# Patient Record
Sex: Male | Born: 1990 | Race: White | Marital: Single | Smoking: Current every day smoker
Health system: Southern US, Community
[De-identification: ages and names within clinical notes are randomized; demographics above are authoritative.]

---

## 2017-06-13 ENCOUNTER — Emergency Department: Payer: Self-pay

## 2017-06-13 ENCOUNTER — Inpatient Hospital Stay
Admission: EM | Admit: 2017-06-13 | Discharge: 2017-06-14 | DRG: 158 | Disposition: A | Discharge status: Home / Self Care | Payer: Self-pay | Attending: Internal Medicine | Admitting: Internal Medicine

## 2017-06-13 DIAGNOSIS — K047 Periapical abscess without sinus: Principal | ICD-10-CM | POA: Diagnosis present

## 2017-06-13 DIAGNOSIS — K122 Cellulitis and abscess of mouth: Secondary | ICD-10-CM | POA: Diagnosis present

## 2017-06-13 DIAGNOSIS — F17200 Nicotine dependence, unspecified, uncomplicated: Secondary | ICD-10-CM | POA: Diagnosis present

## 2017-06-13 DIAGNOSIS — K0889 Other specified disorders of teeth and supporting structures: Secondary | ICD-10-CM

## 2017-06-13 DIAGNOSIS — F1010 Alcohol abuse, uncomplicated: Secondary | ICD-10-CM | POA: Diagnosis present

## 2017-06-13 DIAGNOSIS — K029 Dental caries, unspecified: Secondary | ICD-10-CM | POA: Diagnosis present

## 2017-06-13 DIAGNOSIS — L0291 Cutaneous abscess, unspecified: Secondary | ICD-10-CM | POA: Diagnosis present

## 2017-06-13 DIAGNOSIS — R1310 Dysphagia, unspecified: Secondary | ICD-10-CM | POA: Diagnosis present

## 2017-06-13 LAB — CBC WITH DIFFERENTIAL/PLATELET
BASOS ABS: 0 10*3/uL (ref 0–0.1)
BASOS PCT: 0 %
EOS PCT: 0 %
Eosinophils Absolute: 0 10*3/uL (ref 0–0.7)
HCT: 41.7 % (ref 40.0–52.0)
Hemoglobin: 15.1 g/dL (ref 13.0–18.0)
Lymphocytes Relative: 11 %
Lymphs Abs: 1.4 10*3/uL (ref 1.0–3.6)
MCH: 33.4 pg (ref 26.0–34.0)
MCHC: 36.2 g/dL — AB (ref 32.0–36.0)
MCV: 92.4 fL (ref 80.0–100.0)
MONOS PCT: 8 %
Monocytes Absolute: 0.9 10*3/uL (ref 0.2–1.0)
Neutro Abs: 9.6 10*3/uL — ABNORMAL HIGH (ref 1.4–6.5)
Neutrophils Relative %: 81 %
PLATELETS: 170 10*3/uL (ref 150–440)
RBC: 4.51 MIL/uL (ref 4.40–5.90)
RDW: 12.1 % (ref 11.5–14.5)
WBC: 11.9 10*3/uL — ABNORMAL HIGH (ref 3.8–10.6)

## 2017-06-13 LAB — COMPREHENSIVE METABOLIC PANEL
ALBUMIN: 5 g/dL (ref 3.5–5.0)
ALK PHOS: 46 U/L (ref 38–126)
ALT: 16 U/L — AB (ref 17–63)
AST: 21 U/L (ref 15–41)
Anion gap: 10 (ref 5–15)
BILIRUBIN TOTAL: 2.7 mg/dL — AB (ref 0.3–1.2)
BUN: 10 mg/dL (ref 6–20)
CALCIUM: 9.7 mg/dL (ref 8.9–10.3)
CO2: 25 mmol/L (ref 22–32)
Chloride: 100 mmol/L — ABNORMAL LOW (ref 101–111)
Creatinine, Ser: 0.87 mg/dL (ref 0.61–1.24)
GFR calc Af Amer: >60 mL/min (ref >60)
GFR calc non Af Amer: >60 mL/min (ref >60)
GLUCOSE: 110 mg/dL — AB (ref 65–99)
Potassium: 3.7 mmol/L (ref 3.5–5.1)
Sodium: 135 mmol/L (ref 135–145)
TOTAL PROTEIN: 9.2 g/dL — AB (ref 6.5–8.1)

## 2017-06-13 MED ORDER — ONDANSETRON HCL 4 MG PO TABS: 4.0000 mg | ORAL_TABLET | Freq: Four times a day (QID) | ORAL | Status: DC | PRN

## 2017-06-13 MED ORDER — DEXAMETHASONE SODIUM PHOSPHATE 10 MG/ML IJ SOLN
10.0000 mg | Freq: Once | INTRAMUSCULAR | Status: AC
  Administered 2017-06-13: 10 mg via INTRAVENOUS
  Filled 2017-06-13: qty 1

## 2017-06-13 MED ORDER — AMPICILLIN-SULBACTAM SODIUM 3 (2-1) G IJ SOLR
3.0000 g | Freq: Once | INTRAMUSCULAR | Status: AC
  Administered 2017-06-13: 3 g via INTRAVENOUS

## 2017-06-13 MED ORDER — DEXAMETHASONE SODIUM PHOSPHATE 10 MG/ML IJ SOLN
10.0000 mg | Freq: Three times a day (TID) | INTRAMUSCULAR | Status: DC
Start: 2017-06-13 — End: 2017-06-14
  Administered 2017-06-13 – 2017-06-14 (×2): 10 mg via INTRAVENOUS
  Filled 2017-06-13 (×2): qty 1

## 2017-06-13 MED ORDER — PANTOPRAZOLE SODIUM 40 MG PO TBEC: 40.0000 mg | DELAYED_RELEASE_TABLET | Freq: Every day | ORAL | Status: DC

## 2017-06-13 MED ORDER — SODIUM CHLORIDE 0.9 % IV SOLN
3.0000 g | Freq: Once | INTRAVENOUS | Status: DC
  Filled 2017-06-13: qty 3

## 2017-06-13 MED ORDER — ONDANSETRON HCL 4 MG/2ML IJ SOLN: 4.0000 mg | Freq: Four times a day (QID) | INTRAMUSCULAR | Status: DC | PRN

## 2017-06-13 MED ORDER — METHYLPREDNISOLONE SODIUM SUCC 125 MG IJ SOLR
125.0000 mg | Freq: Once | INTRAMUSCULAR | Status: AC
  Administered 2017-06-13: 125 mg via INTRAVENOUS
  Filled 2017-06-13: qty 2

## 2017-06-13 MED ORDER — CLINDAMYCIN PHOSPHATE 600 MG/50ML IV SOLN
600.0000 mg | Freq: Once | INTRAVENOUS | Status: AC
Start: 2017-06-13 — End: 2017-06-13
  Administered 2017-06-13: 600 mg via INTRAVENOUS
  Filled 2017-06-13: qty 50

## 2017-06-13 MED ORDER — SENNOSIDES-DOCUSATE SODIUM 8.6-50 MG PO TABS: 1.0000 | ORAL_TABLET | Freq: Every evening | ORAL | Status: DC | PRN

## 2017-06-13 MED ORDER — IOPAMIDOL (ISOVUE-300) INJECTION 61%
75.0000 mL | Freq: Once | INTRAVENOUS | Status: AC | PRN
  Administered 2017-06-13: 75 mL via INTRAVENOUS

## 2017-06-13 MED ORDER — SODIUM CHLORIDE 0.9 % IV BOLUS (SEPSIS)
1000.0000 mL | Freq: Once | INTRAVENOUS | Status: AC
  Administered 2017-06-13: 1000 mL via INTRAVENOUS

## 2017-06-13 MED ORDER — KETOROLAC TROMETHAMINE 15 MG/ML IJ SOLN: 15.0000 mg | Freq: Four times a day (QID) | INTRAMUSCULAR | Status: DC | PRN

## 2017-06-13 MED ORDER — MORPHINE SULFATE (PF) 4 MG/ML IV SOLN
4.0000 mg | Freq: Once | INTRAVENOUS | Status: AC
  Administered 2017-06-13: 4 mg via INTRAVENOUS
  Filled 2017-06-13: qty 1

## 2017-06-13 MED ORDER — SODIUM CHLORIDE 0.9 % IV SOLN
3.0000 g | Freq: Four times a day (QID) | INTRAVENOUS | Status: DC
  Administered 2017-06-13 – 2017-06-14 (×4): 3 g via INTRAVENOUS
  Filled 2017-06-13 (×6): qty 3

## 2017-06-13 MED ORDER — ACETAMINOPHEN 650 MG RE SUPP: 650.0000 mg | Freq: Four times a day (QID) | RECTAL | Status: DC | PRN

## 2017-06-13 MED ORDER — IBUPROFEN 400 MG PO TABS: 600.0000 mg | ORAL_TABLET | Freq: Four times a day (QID) | ORAL | Status: DC | PRN

## 2017-06-13 MED ORDER — ONDANSETRON HCL 4 MG/2ML IJ SOLN
4.0000 mg | Freq: Once | INTRAMUSCULAR | Status: AC
  Administered 2017-06-13: 4 mg via INTRAVENOUS
  Filled 2017-06-13: qty 2

## 2017-06-13 MED ORDER — ACETAMINOPHEN 325 MG PO TABS: 650.0000 mg | ORAL_TABLET | Freq: Four times a day (QID) | ORAL | Status: DC | PRN

## 2017-06-13 MED ORDER — SODIUM CHLORIDE 0.9 % IV SOLN
INTRAVENOUS | Status: AC
  Administered 2017-06-13 – 2017-06-14 (×2): via INTRAVENOUS

## 2017-06-13 NOTE — ED Notes (Signed)
NAD noted at this time. CIWA performed per MD order. Pt visualized in NAD, resting in bed, playing on phone and watching TV. Pt had visitor come to room at this time. Will continue to monitor for further patient needs.

## 2017-06-13 NOTE — ED Notes (Signed)
To CT scan via stretcher.  AAOx3.  Skin warm and dry.  NAD 

## 2017-06-13 NOTE — ED Triage Notes (Signed)
Pt c/o toothache that started on Friday and today having facial swelling..Marland Kitchen

## 2017-06-13 NOTE — ED Notes (Signed)
More swelling noted to roof of mouth  Provider in with pt at this time  Report called to Erskine SquibbJane RN  And pt moved to room 26

## 2017-06-13 NOTE — Progress Notes (Signed)
Patient admitted from ED, IV fluids infusing. No complaints at this time.

## 2017-06-13 NOTE — ED Notes (Signed)
Dr. Ellan LambertMqueen at bedside.

## 2017-06-13 NOTE — H&P (Signed)
[facility] [place] [facility]   PATIENT NAME: Andrew Spence    MR#:  [id number]  DATE OF BIRTH:  1990-12-31  DATE OF ADMISSION:  06/13/2017  PRIMARY CARE PHYSICIAN: Patient, No Pcp Per   REQUESTING/REFERRING PHYSICIAN: Derrill Kay  CHIEF COMPLAINT:  Toothache and left jaw swelling  HISTORY OF PRESENT ILLNESS:  Andrew Spence  is a 26 y.o. male with No known medical history is presenting to the ED with a chief complaint of 3 day history of tooth pain and today history of left jaw swelling with difficulty swallowing but denies any shortness of breath or chest pain. Left jaw pain is radiating to the right jaw. No similar complaints in the past and was not seen by any dentist Discussed with the ENT on call, who has recommended IV antibiotics and steroids  PAST MEDICAL HISTORY:  History reviewed. No pertinent past medical history.  PAST SURGICAL HISTOIRY:  History reviewed. No pertinent surgical history.  SOCIAL HISTORY:   Social History  Substance Use Topics  . Smoking status: Current Every Day Smoker  . Smokeless tobacco: Never Used  . Alcohol use Yes    FAMILY HISTORY:  Reports a grandfather and grandmother has cancers doesn't know the name of the cancer  DRUG ALLERGIES:  No Known Allergies  REVIEW OF SYSTEMS:  CONSTITUTIONAL: No fever, fatigue or weakness.  EYES: No blurred or double vision.  EARS, NOSE, AND THROAT: Reporting toothache and left jaw pain radiating to the right jaw and neck RESPIRATORY: No cough, shortness of breath, wheezing or hemoptysis.  CARDIOVASCULAR: No chest pain, orthopnea, edema.  GASTROINTESTINAL: No nausea, vomiting, diarrhea or abdominal pain.  GENITOURINARY: No dysuria, hematuria.  ENDOCRINE: No polyuria, nocturia,  HEMATOLOGY: No anemia, easy bruising or bleeding SKIN: No rash or lesion. MUSCULOSKELETAL: No joint pain or arthritis.   NEUROLOGIC: No tingling, numbness, weakness.  PSYCHIATRY: No anxiety or  depression.   MEDICATIONS AT HOME:   Prior to Admission medications   Not on File      VITAL SIGNS:  Blood pressure (!) 137/95, pulse 92, temperature 99 F (37.2 C), temperature source Oral, resp. rate 16, height 6\' 2"  (1.88 m), weight 113.4 kg (250 lb), SpO2 99 %.  PHYSICAL EXAMINATION:  GENERAL:  26 y.o.-year-old patient lying in the bed with no acute distress.  EYES: Pupils equal, round, reactive to light and accommodation. No scleral icterus. Extraocular muscles intact.  HEENT:Left jaw is tender and edematous. Head atraumatic, normocephalic. Oropharynx-could not examine as patient is unable to open his mouth completely NECK:  Supple, anterior cervical lymphadenopathy is present ,no jugular venous distention. No thyroid enlargement LUNGS: Normal breath sounds bilaterally, no wheezing, rales,rhonchi or crepitation. No use of accessory muscles of respiration.  CARDIOVASCULAR: S1, S2 normal. No murmurs, rubs, or gallops.  ABDOMEN: Soft, nontender, nondistended. Bowel sounds present. No organomegaly or mass.  EXTREMITIES: No pedal edema, cyanosis, or clubbing.  NEUROLOGIC: Cranial nerves II through XII are intact. Muscle strength 5/5 in all extremities. Sensation intact. Gait not checked.  PSYCHIATRIC: The patient is alert and oriented x 3.  SKIN: No obvious rash, lesion, or ulcer.   LABORATORY PANEL:   CBC  Recent Labs Lab 06/13/17 1014  WBC 11.9*  HGB 15.1  HCT 41.7  PLT 170   ------------------------------------------------------------------------------------------------------------------  Chemistries   Recent Labs Lab 06/13/17 1014  NA 135  K 3.7  CL 100*  CO2 25  GLUCOSE 110*  BUN 10  CREATININE 0.87  CALCIUM 9.7  AST  21  ALT 16*  ALKPHOS 46  BILITOT 2.7*   ------------------------------------------------------------------------------------------------------------------  Cardiac Enzymes No results for input(s): TROPONINI in the last 168  hours. ------------------------------------------------------------------------------------------------------------------  RADIOLOGY:  Ct Soft Tissue Neck W Contrast  Result Date: 06/13/2017 CLINICAL DATA:  Facial and neck swelling. Dental pain. Elevated white blood count. EXAM: CT NECK WITH CONTRAST TECHNIQUE: Multidetector CT imaging of the neck was performed using the standard protocol following the bolus administration of intravenous contrast. CONTRAST:  75mL ISOVUE-300 IOPAMIDOL (ISOVUE-300) INJECTION 61% COMPARISON:  None. FINDINGS: Pharynx and larynx: Mild hypertrophy of the tonsils bilaterally. No mass or peritonsillar abscess. Remainder of the pharynx is normal. Salivary glands: Parotid and submandibular glands normal bilaterally. Thyroid: Negative Lymph nodes: Prominent lymph nodes throughout the neck bilaterally. Right level 2 node 10 mm. Left level 2 node 14 mm. Left level 2 lymph node 10 mm. Multiple small posterior nodes bilaterally. These likely reactive due to infection given the history. Vascular: Patent Limited intracranial: Negative Visualized orbits: Negative Mastoids and visualized paranasal sinuses: Negative Skeleton: No acute skeletal abnormality. Mild lucency around left lower first molar with associated caries. No evidence of periapical abscess. Upper chest: Negative Other: Subcutaneous edema is present below the chin extending to the platysmas muscle. 6 x 12 mm fluid collection deep to the left mylohyoid muscle consistent with early abscess in the floor of the mouth. IMPRESSION: 6 x 12 mm rim enhancing fluid collection deep to the left mylohyoid muscle compatible with abscess. 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 No evidence of acute dental abscess. There is soft tissue edema throughout the chin and upper neck. Reactive cervical lymphadenopathy. These results were called by telephone at the time of interpretation on 06/13/2017 at 11:34 am to Dr. Derrill KayGoodman ,  who verbally acknowledged these results. Electronically Signed   By: Marlan Palauharles  Clark M.D.   On: 06/13/2017 11:34    EKG:  No orders found for this or any previous visit.  IMPRESSION AND PLAN:  Andrew Spence  is a 26 y.o. male with No known medical history is presenting to the ED with a chief complaint of 3 day history of tooth pain and today history of left jaw swelling with difficulty swallowing but denies any shortness of breath or chest pain. Left jaw pain is radiating to the right jaw.  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 Jenne CampusMcQueen Gentle hydration with IV fluids  #Tooth ache with dysphagia Soft diet and pain management as needed  #Substance abuse Counseled patient to stop using illicit drugs including weed  #Alcohol use disorder CIWA    All the records are reviewed and case discussed with ED provider. Management plans discussed with the patient, family and they are in agreement.  CODE STATUS: FC  TOTAL TIME TAKING CARE OF THIS PATIENT: 43 minutes.   Note: This dictation was prepared with Dragon dictation along with smaller phrase technology. Any transcriptional errors that result from this process are unintentional.  Ramonita LabGouru, Tiarna Koppen M.D on 06/13/2017 at 12:36 PM  Between 7am to 6pm - Pager - [phone]  After 6pm go to [email or web address] - password [email or web address]  [facility]  Office  [phone]  CC: Primary care physician; Patient, No Pcp Per

## 2017-06-13 NOTE — ED Notes (Addendum)
See triage note.  States he developed tooth pain on Friday  States he thinks a has a cavity on the left   But sat developed swelling  Swelling in mainly under is chin unsure of fever at home but has had chills

## 2017-06-13 NOTE — ED Provider Notes (Signed)
[facility] Emergency Department Provider Note   ____________________________________________   I have reviewed the triage vital signs and the nursing notes.   HISTORY  Chief Complaint Dental Pain   History limited by: Not Limited   HPI Andrew Spence is a 26 y.o. male who presents to the emergency department today because of concerns for dental pain and swelling. The patient states that the dental pain started 2 days ago. Located in his left lower molar. He states that since that time the pain is actually gotten better but he has no some increased swelling on the left side. He denies any traumatic injury to his tooth but states he does have a history of cavities and does not follow with dentistry. Patient denies any fevers. Denies any difficulty with breathing.   History reviewed. No pertinent past medical history.  There are no active problems to display for this patient.   History reviewed. No pertinent surgical history.  Prior to Admission medications   Not on File    Allergies Patient has no known allergies.  No family history on file.  Social History Social History  Substance Use Topics  . Smoking status: Current Every Day Smoker  . Smokeless tobacco: Never Used  . Alcohol use Yes    Review of Systems Constitutional: No fever/chills Eyes: No visual changes. ENT: Positive for dental pain and swelling.  Cardiovascular: Denies chest pain. Respiratory: Denies shortness of breath. Gastrointestinal: No abdominal pain.  No nausea, no vomiting.  No diarrhea.   Genitourinary: Negative for dysuria. Musculoskeletal: Negative for back pain. Skin: Negative for rash. Neurological: Negative for headaches, focal weakness or numbness.  ____________________________________________   PHYSICAL EXAM:  VITAL SIGNS: ED Triage Vitals  Enc Vitals Group     BP 06/13/17 0940 (!) 173/84     Pulse Rate 06/13/17 0940 98     Resp 06/13/17 0940 18      Temp 06/13/17 0940 99 F (37.2 C)     Temp Source 06/13/17 0940 Oral     SpO2 06/13/17 0940 100 %     Weight 06/13/17 0925 250 lb (113.4 kg)     Height 06/13/17 0925 6\' 2"  (1.88 m)    Constitutional: Alert and oriented. Well appearing and in no distress. Eyes: Conjunctivae are normal.  ENT   Head: Normocephalic and atraumatic.   Nose: No congestion/rhinnorhea.   Mouth/Throat: Sublingual space soft. Some submandibular swelling. Poor dentition.   Neck: No stridor. Hematological/Lymphatic/Immunilogical: No cervical lymphadenopathy. Cardiovascular: Normal rate, regular rhythm.  No murmurs, rubs, or gallops. Respiratory: Normal respiratory effort without tachypnea nor retractions. Breath sounds are clear and equal bilaterally. No wheezes/rales/rhonchi. Gastrointestinal: Soft and non tender. No rebound. No guarding.  Genitourinary: Deferred Musculoskeletal: Normal range of motion in all extremities. No lower extremity edema. Neurologic:  Normal speech and language. No gross focal neurologic deficits are appreciated.  Skin:  Skin is warm, dry and intact. No rash noted. Psychiatric: Mood and affect are normal. Speech and behavior are normal. Patient exhibits appropriate insight and judgment.  ____________________________________________    LABS (pertinent positives/negatives)  Labs Reviewed  COMPREHENSIVE METABOLIC PANEL - Abnormal; Notable for the following:       Result Value   Chloride 100 (*)    Glucose, Bld 110 (*)    Total Protein 9.2 (*)    ALT 16 (*)    Total Bilirubin 2.7 (*)    All other components within normal limits  CBC WITH DIFFERENTIAL/PLATELET - Abnormal; Notable for the following:  WBC 11.9 (*)    MCHC 36.2 (*)    Neutro Abs 9.6 (*)    All other components within normal limits  CULTURE, BLOOD (ROUTINE X 2)  CULTURE, BLOOD (ROUTINE X 2)      ____________________________________________   EKG  None  ____________________________________________    RADIOLOGY  CT soft tissue neck  IMPRESSION: 6 x 12 mm rim enhancing fluid collection deep to the left mylohyoid muscle compatible with abscess. 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 No evidence of acute dental abscess. There is soft tissue edema throughout the chin and upper neck.  Reactive cervical lymphadenopathy.   ____________________________________________   PROCEDURES  Procedures  ____________________________________________   INITIAL IMPRESSION / ASSESSMENT AND PLAN / ED COURSE  Pertinent labs & imaging results that were available during my care of the patient were reviewed by me and considered in my medical decision making (see chart for details).  Patient presented to the emergency department today with dental pain and swelling. On exam he does have some submandibular fullness. CT scan was obtained which did show a abscess. Discussed with Dr. Jenne Campus with ENT. At this point no concerning signs of airway compromise. Recommended IV antibiotics, Decadron and admission. Have talked to the hospitalist for admission.  ____________________________________________   FINAL CLINICAL IMPRESSION(S) / ED DIAGNOSES  Final diagnoses:  Pain, dental  Abscess     Note: This dictation was prepared with Dragon dictation. Any transcriptional errors that result from this process are unintentional     Phineas Semen, MD 06/13/17 [phone]

## 2017-06-13 NOTE — ED Notes (Signed)
This RN to bedside, pt resting in bed at this time with family at bedside. Pt had questions regarding admission, this RN answered questions to the best of her ability. Pt and family state understanding. Will continue to monitor for further patient needs.

## 2017-06-13 NOTE — Consult Note (Signed)
[place] Lions[place] Tou[place] [id number] 09/07/1991 Ramonita LabGouru, Aruna, MD  Reason for Consult: Small floor mouth abscess  HPI: 26 year old, Friday had severe left posterior mandibular toothache Saturday morning woke up had swelling of the left neck anterior to the submental area. He has had no airway compromise has a normal voice. CT scan in the emergency room showed approximate 1 cm fluid collection left of midline around the mylohyoid muscle. ENT was asked to evaluate.  Allergies: No Known Allergies  ROS: Review of systems normal other than 12 systems except per HPI.  PMH: History reviewed. No pertinent past medical history.  FH: No family history on file.  SH:  Social History   Social History  . Marital status: Single    Spouse name: N/A  . Number of children: N/A  . Years of education: N/A   Occupational History  . Not on file.   Social History Main Topics  . Smoking status: Current Every Day Smoker  . Smokeless tobacco: Never Used  . Alcohol use Yes  . Drug use: No  . Sexual activity: Yes   Other Topics Concern  . Not on file   Social History Narrative  . No narrative on file    PSH: History reviewed. No pertinent surgical history.  Physical  Exam: Sitting up in bed in no apparent distress normal voice no airway compromise CN 2-12 grossly intact and symmetric. EAC/TMs normal BL. Oral cavity shows minimal swelling in the anterior floor mouth there is no tongue swelling noted no palpable abscess. In the left mandibular premolar region there is a mandibular molar with an obvious abscess in the tooth with a hole in the top of the tooth consistent with a tooth abscess. Skin warm and dry. Nasal cavity without polyps or purulence. External nose and ears without masses or lesions. EOMI, PERRLA. Neck palpation shows some soft tissue swelling in his left submandibular area as well as the submental region. Thyroid normal with no masses.   CT scan review shows a small 1 cm abscess with some mild  to moderate soft tissue swelling surrounding this.  A/P: Left mandibular periapical tooth abscess with obvious rotten to the left throat posterior premolar region. I think this infection will respond nicely to IV antibiotics would recommend Unasyn 3 g IV every 6 hours and Decadron 10 mg IV every 8 hours for 24 hours. If his symptoms worsen would recommend repeat CT scan and reconsult ENT. If his condition significantly improves which I believe it will would discharge home on the following medications. I have spoken with the patient he is not able to swallow large pills and does not have insurance so would recommend contact care management to help him with paying  for antibiotics otherwise he will be back in the emergency room; would also ask care management to help him secure a dental appointment to have this tooth either removed or repaired. Discharge medications should include Augmentin ES suspension 600 mg per 5 mL's 1 teaspoon by mouth twice a day for 10 days AND amoxicillin 400 mg per 5 mL 1 teaspoon 3 times a day for 10 days. Would also recommend a 12 day double strength Sterapred taper he says he thinks he can swallow the small prednisone tabs. He can follow-up as an outpatient with [facility] to see me in 2 weeks. He definitely needs to see the dentist ASAP.  Patient was seen in the emergency room approximate 30 minute visit.   Aveen Stansel T 06/13/2017 2:17 PM

## 2017-06-13 NOTE — ED Provider Notes (Signed)
[facility] Emergency Department Provider Note ____________________________________________  Time seen: Approximately 9:45 AM  I have reviewed the triage vital signs and the nursing notes.   HISTORY  Chief Complaint Dental Pain   HPI Andrew Spence is a 26 y.o. male who presents to the emergency department for evaluation of dental pain, facial and neck swelling. Dental pain started 2 days ago and swelling started last night. He awakened this morning with increase in swelling of the neck and states he has to "swallow hard." He has not taken anything for pain.   History reviewed. No pertinent past medical history.  Patient Active Problem List   Diagnosis Date Noted  . Abscess 06/13/2017    History reviewed. No pertinent surgical history.  Prior to Admission medications   Not on File    Allergies Patient has no known allergies.  No family history on file.  Social History Social History  Substance Use Topics  . Smoking status: Current Every Day Smoker  . Smokeless tobacco: Never Used  . Alcohol use Yes    Review of Systems Constitutional: Negative for fever  ENT: Positive for dental pain, facial and neck swelling, and difficulty swallowing. Musculoskeletal: Mild trismus.  Skin: Negative for erythema or edema. ____________________________________________   PHYSICAL EXAM:  VITAL SIGNS: ED Triage Vitals  Enc Vitals Group     BP 06/13/17 0940 (!) 173/84     Pulse Rate 06/13/17 0940 98     Resp 06/13/17 0940 18     Temp 06/13/17 0940 99 F (37.2 C)     Temp Source 06/13/17 0940 Oral     SpO2 06/13/17 0940 100 %     Weight 06/13/17 0925 250 lb (113.4 kg)     Height 06/13/17 0925 6\' 2"  (1.88 m)     Head Circumference --      Peak Flow --      Pain Score --      Pain Loc --      Pain Edu? --      Excl. in GC? --     Constitutional: Alert and oriented. Well appearing and in no acute distress. Eyes: Conjunctivae are normal without  erythema or edma. Mouth/Throat: Airway patent. Speech is clear. Mild swelling of the floor of the mouth. Periodontal Exam    Respiratory: No shortness of breath. Musculoskeletal: AFROM Neurologic: Alert and oriented x 4.  Skin:  No obvious abscess or lesion. ___________________________________________   LABS (all labs ordered are listed, but only abnormal results are displayed)  Labs Reviewed  COMPREHENSIVE METABOLIC PANEL - Abnormal; Notable for the following:       Result Value   Chloride 100 (*)    Glucose, Bld 110 (*)    Total Protein 9.2 (*)    ALT 16 (*)    Total Bilirubin 2.7 (*)    All other components within normal limits  CBC WITH DIFFERENTIAL/PLATELET - Abnormal; Notable for the following:    WBC 11.9 (*)    MCHC 36.2 (*)    Neutro Abs 9.6 (*)    All other components within normal limits  CULTURE, BLOOD (ROUTINE X 2)  CULTURE, BLOOD (ROUTINE X 2)   ____________________________________________   RADIOLOGY  Pending. ____________________________________________   PROCEDURES  Procedure(s) performed: None  Critical Care performed: No ____________________________________________   INITIAL IMPRESSION / ASSESSMENT AND PLAN / ED COURSE  Andrew Spence is a 26 y.o. male who presents to the emergency department for evaluation of dental pain and facial swelling.  Exam and symptoms concerning for early Ludwig's Angina. Patient will be moved to room 26. Case discussed with Dr. Derrill KayGoodman who will assume patient care.  Pertinent labs & imaging results that were available during my care of the patient were reviewed by me and considered in my medical decision making (see chart for details).  ____________________________________________   FINAL CLINICAL IMPRESSION(S) / ED DIAGNOSES  Final diagnoses:  Pain, dental  Abscess    New Prescriptions   No medications on file    If controlled substance prescribed during this visit, 12 month history viewed on the  [facility] prior to issuing an initial prescription for Schedule II or III opiod.  Note:  This document was prepared using Dragon voice recognition software and may include unintentional dictation errors.    Chinita Pesterriplett, Quanesha Klimaszewski B, FNP 06/13/17 1322    Phineas SemenGoodman, [place], MD 06/13/17 1415

## 2017-06-13 NOTE — ED Notes (Signed)
This RN to bedside at this time with Vanessa Barbaraonnor, Extern. This RN and Fredricka BonineConnor, introduced selves to patient. Pt visualized in NAD at this time. Dr. Derrill KayGoodman to bedside at this time as well. Respirations even and unlabored. Pt states he feels like the swelling has not gotten any worse.

## 2017-06-14 LAB — BASIC METABOLIC PANEL
ANION GAP: 8 (ref 5–15)
BUN: 12 mg/dL (ref 6–20)
CALCIUM: 9.5 mg/dL (ref 8.9–10.3)
CO2: 25 mmol/L (ref 22–32)
Chloride: 102 mmol/L (ref 101–111)
Creatinine, Ser: 0.92 mg/dL (ref 0.61–1.24)
GFR calc non Af Amer: >60 mL/min (ref >60)
GLUCOSE: 166 mg/dL — AB (ref 65–99)
POTASSIUM: 4.6 mmol/L (ref 3.5–5.1)
Sodium: 135 mmol/L (ref 135–145)

## 2017-06-14 LAB — CBC
HEMATOCRIT: 39.2 % — AB (ref 40.0–52.0)
HEMOGLOBIN: 14.1 g/dL (ref 13.0–18.0)
MCH: 32.7 pg (ref 26.0–34.0)
MCHC: 36 g/dL (ref 32.0–36.0)
MCV: 90.7 fL (ref 80.0–100.0)
Platelets: 179 10*3/uL (ref 150–440)
RBC: 4.31 MIL/uL — AB (ref 4.40–5.90)
RDW: 12.3 % (ref 11.5–14.5)
WBC: 12 10*3/uL — AB (ref 3.8–10.6)

## 2017-06-14 MED ORDER — ACETAMINOPHEN 325 MG PO TABS: 650.0000 mg | ORAL_TABLET | Freq: Four times a day (QID) | ORAL | Status: AC | PRN

## 2017-06-14 MED ORDER — AMOXICILLIN-POT CLAVULANATE 600-42.9 MG/5ML PO SUSR: 600.0000 mg | Freq: Two times a day (BID) | ORAL | 0 refills | Status: AC

## 2017-06-14 MED ORDER — PREDNISONE 10 MG (21) PO TBPK: ORAL_TABLET | ORAL | 0 refills | Status: AC

## 2017-06-14 MED ORDER — AMOXICILLIN-POT CLAVULANATE 250-62.5 MG/5ML PO SUSR: 250.0000 mg | Freq: Two times a day (BID) | ORAL | 0 refills | Status: DC

## 2017-06-14 MED ORDER — IBUPROFEN 600 MG PO TABS: 600.0000 mg | ORAL_TABLET | Freq: Four times a day (QID) | ORAL | 0 refills | Status: AC | PRN

## 2017-06-14 NOTE — Discharge Summary (Signed)
[facility] [place] at [facility], 26 y.o., DOB 02-27-1991, MRN [id number]. 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 He has had no airway compromise has a normal voice. CT scan in the emergency room showed approximate 1 cm fluid collection left of midline around the mylohyoid muscle. He was seen by ENT recommended admission and IV antibiotics  An IV steroids. His symptoms started to improve significantly. He is doing much better. He will need out pt dentist f/u. Recommended that he go to [facility] dental clinic.            Consults  ENT  Significant Tests:  See full reports for all details     Ct Soft Tissue Neck W Contrast  Result Date: 06/13/2017 CLINICAL DATA:  Facial and neck swelling. Dental pain. Elevated white blood count. EXAM: CT NECK WITH CONTRAST TECHNIQUE: Multidetector CT imaging of the neck was performed using the standard protocol following the bolus administration of intravenous contrast. CONTRAST:  75mL ISOVUE-300 IOPAMIDOL (ISOVUE-300) INJECTION 61% COMPARISON:  None. FINDINGS: Pharynx and larynx: Mild hypertrophy of the tonsils bilaterally. No mass or peritonsillar abscess. Remainder of the pharynx is normal. Salivary glands: Parotid and submandibular glands normal bilaterally. Thyroid: Negative Lymph nodes: Prominent lymph nodes throughout the neck bilaterally. Right level 2 node 10 mm. Left level 2 node 14 mm. Left level 2 lymph node 10 mm. Multiple small posterior nodes bilaterally. These likely  reactive due to infection given the history. Vascular: Patent Limited intracranial: Negative Visualized orbits: Negative Mastoids and visualized paranasal sinuses: Negative Skeleton: No acute skeletal abnormality. Mild lucency around left lower first molar with associated caries. No evidence of periapical abscess. Upper chest: Negative Other: Subcutaneous edema is present below the chin extending to the platysmas muscle. 6 x 12 mm fluid collection deep to the left mylohyoid muscle consistent with early abscess in the floor of the mouth. IMPRESSION: 6 x 12 mm rim enhancing fluid collection deep to the left mylohyoid muscle compatible with abscess. 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 No evidence of acute dental abscess. There is soft tissue edema throughout the chin and upper neck. Reactive cervical lymphadenopathy. These results were called by telephone at the time of interpretation on 06/13/2017 at 11:34 am to Dr. Derrill Kay , who verbally acknowledged these results. Electronically Signed   By: Marlan Palau M.D.   On: 06/13/2017 11:34       Today   Subjective:   [place]  Feels much better swelling less  Objective:   Blood pressure (!) 150/60, pulse 82, temperature 97.9 F (36.6 C), temperature source Oral, resp. rate 20, height 6\' 2"  (1.88 m), weight 250 lb (113.4 kg), SpO2 100 %.  .  Intake/Output Summary (Last 24 hours) at 06/14/17 1330 Last data filed at 06/14/17 1022  Gross per 24 hour  Intake             1730 ml  Output                0 ml  Net  1730 ml    Exam VITAL SIGNS: Blood pressure (!) 150/60, pulse 82, temperature 97.9 F (36.6 C), temperature source Oral, resp. rate 20, height 6\' 2"  (1.88 m), weight 250 lb (113.4 kg), SpO2 100 %.  GENERAL:  26 y.o.-year-old patient lying in the bed with no acute distress.  EYES: Pupils equal, round, reactive to light and accommodation. No scleral icterus. Extraocular muscles intact.  HEENT:  Head atraumatic, normocephalic. Oropharynx and nasopharynx clear.  NECK:  Supple, no jugular venous distention. No thyroid enlargement, no tenderness.  LUNGS: Normal breath sounds bilaterally, no wheezing, rales,rhonchi or crepitation. No use of accessory muscles of respiration.  CARDIOVASCULAR: S1, S2 normal. No murmurs, rubs, or gallops.  ABDOMEN: Soft, nontender, nondistended. Bowel sounds present. No organomegaly or mass.  EXTREMITIES: No pedal edema, cyanosis, or clubbing.  NEUROLOGIC: Cranial nerves II through XII are intact. Muscle strength 5/5 in all extremities. Sensation intact. Gait not checked.  PSYCHIATRIC: The patient is alert and oriented x 3.  SKIN: No obvious rash, lesion, or ulcer.   Data Review     CBC w Diff: Lab Results  Component Value Date   WBC 12.0 (H) 06/14/2017   HGB 14.1 06/14/2017   HCT 39.2 (L) 06/14/2017   PLT 179 06/14/2017   LYMPHOPCT 11 06/13/2017   MONOPCT 8 06/13/2017   EOSPCT 0 06/13/2017   BASOPCT 0 06/13/2017   CMP: Lab Results  Component Value Date   NA 135 06/14/2017   K 4.6 06/14/2017   CL 102 06/14/2017   CO2 25 06/14/2017   BUN 12 06/14/2017   CREATININE 0.92 06/14/2017   PROT 9.2 (H) 06/13/2017   ALBUMIN 5.0 06/13/2017   BILITOT 2.7 (H) 06/13/2017   ALKPHOS 46 06/13/2017   AST 21 06/13/2017   ALT 16 (L) 06/13/2017  .  Micro Results Recent Results (from the past 240 hour(s))  Culture, blood (routine x 2)     Status: None (Preliminary result)   Collection Time: 06/13/17 10:14 AM  Result Value Ref Range Status   Specimen Description BLOOD RIGHT ASSIST CONTROL  Final   Special Requests   Final    BOTTLES DRAWN AEROBIC AND ANAEROBIC Blood Culture adequate volume   Culture NO GROWTH < 24 HOURS  Final   Report Status PENDING  Incomplete  Culture, blood (routine x 2)     Status: None (Preliminary result)   Collection Time: 06/13/17 10:15 AM  Result Value Ref Range Status   Specimen Description BLOOD LEFT HAND  Final   Special  Requests   Final    BOTTLES DRAWN AEROBIC AND ANAEROBIC Blood Culture adequate volume   Culture NO GROWTH < 24 HOURS  Final   Report Status PENDING  Incomplete        Code Status Orders        Start     Ordered   06/13/17 1527  Full code  Continuous     06/13/17 1526    Code Status History    Date Active Date Inactive Code Status Order ID Comments User Context   This patient has a current code status but no historical code status.          Follow-up Information    Linus SalmonsMcQueen, Chapman, MD Follow up in 2 week(s).   Specialty:  Otolaryngology Contact information: [address] [place] [id number] [phone]        dental appointment Follow up.   Why:  case manger to provide with list of providers  who can see patient for free of cost or cheap          Discharge Medications   Allergies as of 06/14/2017   No Known Allergies     Medication List    TAKE these medications   acetaminophen 325 MG tablet Commonly known as:  TYLENOL Take 2 tablets (650 mg total) by mouth every 6 (six) hours as needed for mild pain (or Fever >/= 101).   amoxicillin-clavulanate 600-42.9 MG/5ML suspension Commonly known as:  AUGMENTIN ES-600 Take 5 mLs (600 mg total) by mouth 2 (two) times daily.   ibuprofen 600 MG tablet Commonly known as:  ADVIL,MOTRIN Take 1 tablet (600 mg total) by mouth every 6 (six) hours as needed for moderate pain or cramping.   predniSONE 10 MG (21) Tbpk tablet Commonly known as:  STERAPRED UNI-PAK 21 TAB Start at 60 mg taper by 10mg  until complete          Total Time in preparing paper work, data evaluation and todays exam - 35 minutes  Auburn Bilberry M.D on 06/14/2017 at 1:30 PM  [facility]   Office  [phone]

## 2017-06-14 NOTE — Care Management Note (Signed)
Case Management Note  Patient Details  Name: [name] MRN: [id number] Date of Birth: Feb 27, 1991  Subjective/Objective: Met with patient at bedside to disucss uninsured status. Application given for [facility] and [facility]. Referral sent to both agencies. Also referred patient to [facility] dental [facility] for his continued for tooth issues.                   Action/Plan:   Expected Discharge Date:  06/14/17               Expected Discharge Plan:  Home/Self Care  In-House Referral:     Discharge planning Services  CM Consult, [facility], Medication Assistance  Post Acute Care Choice:    Choice offered to:     DME Arranged:    DME Agency:     HH Arranged:    HH Agency:     Status of Service:  Completed, signed off  If discussed at [employer], dates discussed:    Additional Comments:  Jolly Mango, RN 06/14/2017, 11:44 AM

## 2017-06-14 NOTE — Progress Notes (Signed)
Yosgar Casebier to be D/C'd Home per MD order.  Discussed with the patient and all questions fully answered.  VSS, Skin clean, dry and intact without evidence of skin break down, no evidence of skin tears noted. IV catheter discontinued intact. Site without signs and symptoms of complications. Dressing and pressure applied.  An After Visit Summary was printed and given to the patient. Patient received prescription.  D/c education completed with patient/family including follow up instructions, medication list, d/c activities limitations if indicated, with other d/c instructions as indicated by MD - patient able to verbalize understanding, all questions fully answered.   Patient instructed to return to ED, call 911, or call MD for any changes in condition.   Patient escorted via WC, and D/C home via private auto.  Harvie HeckMelanie Roshan Salamon 06/14/2017 2:02 PM

## 2017-06-14 NOTE — Discharge Instructions (Signed)
[facility] - [place] at [facility]  DIET:  soft  DISCHARGE CONDITION:  Stable  ACTIVITY:  Activity as tolerated  OXYGEN:  Home Oxygen: No.   Oxygen Delivery: room air  DISCHARGE LOCATION:  home    ADDITIONAL DISCHARGE INSTRUCTION:   If you experience worsening of your admission symptoms, develop shortness of breath, life threatening emergency, suicidal or homicidal thoughts you must seek medical attention immediately by calling 911 or calling your MD immediately  if symptoms less severe.  You Must read complete instructions/literature along with all the possible adverse reactions/side effects for all the Medicines you take and that have been prescribed to you. Take any new Medicines after you have completely understood and accpet all the possible adverse reactions/side effects.   Please note  You were cared for by a hospitalist during your hospital stay. If you have any questions about your discharge medications or the care you received while you were in the hospital after you are discharged, you can call the unit and asked to speak with the hospitalist on call if the hospitalist that took care of you is not available. Once you are discharged, your primary care physician will handle any further medical issues. Please note that NO REFILLS for any discharge medications will be authorized once you are discharged, as it is imperative that you return to your primary care physician (or establish a relationship with a primary care physician if you do not have one) for your aftercare needs so that they can reassess your need for medications and monitor your lab values.

## 2017-06-18 LAB — CULTURE, BLOOD (ROUTINE X 2)
CULTURE: NO GROWTH
Culture: NO GROWTH
SPECIAL REQUESTS: ADEQUATE
Special Requests: ADEQUATE

## 2018-10-27 IMAGING — CT CT NECK W/ CM
4 of 5 series · 15 of 33 positions shown, 17 images · IV contrast (iopamidol)
Comparison: None.

CLINICAL DATA: Facial and neck swelling. Dental pain. Elevated
white blood count.

EXAM:
CT NECK WITH CONTRAST
TECHNIQUE: Multidetector CT imaging of the neck was performed using the
standard protocol following the bolus administration of intravenous
contrast.
CONTRAST:  75mL LIA2XS-5GG IOPAMIDOL (LIA2XS-5GG) INJECTION 61%

[Series 2: axial neck · axial · 0.52mm/px · z∈[-204,-102]mm · 3 of 129 slices shown]
[im 26/129  bone]
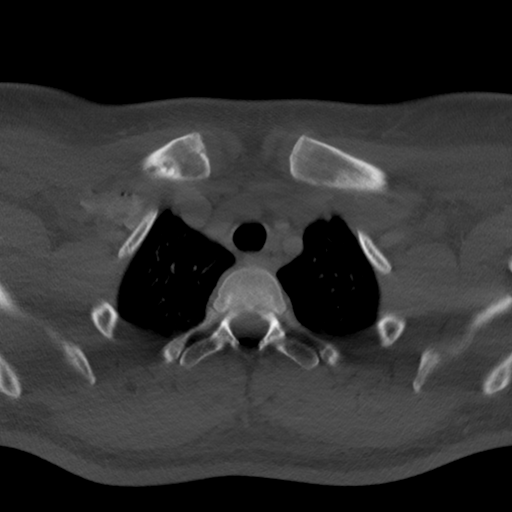
[im 52/129  bone]
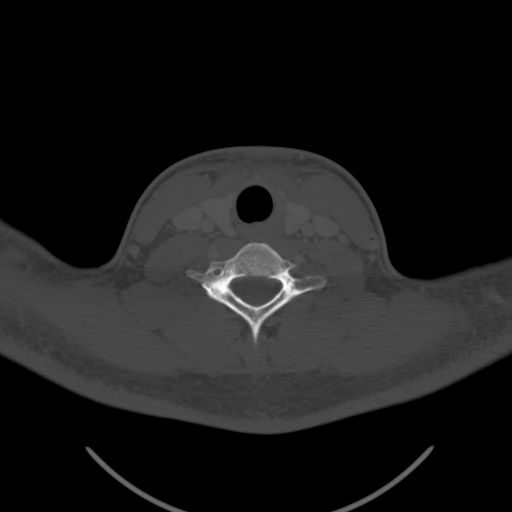
[im 77/129  bone]
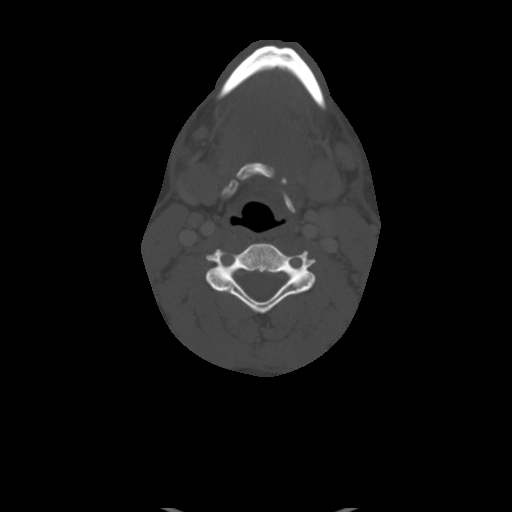

[Series 6: sag neck · sagittal · 0.48mm/px · 5 of 132 slices shown, 6 images]
[im 44/132  bone]
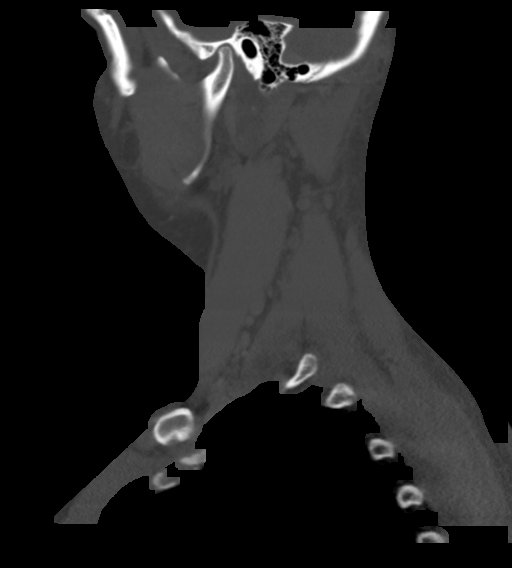
[im 55/132  bone]
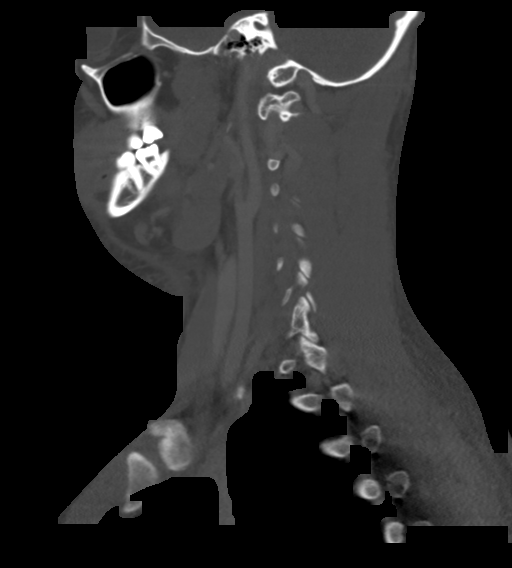
[im 66/132  soft-tissue]
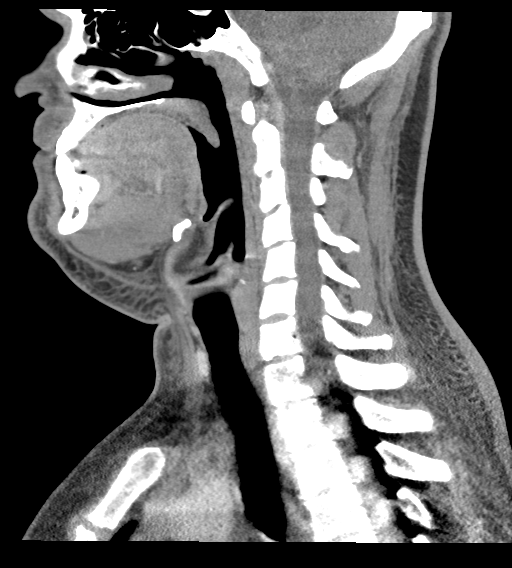
[im 66/132  bone]
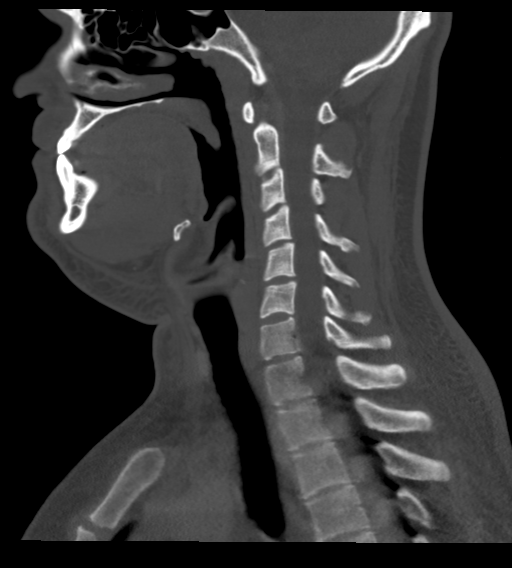
[im 77/132  bone]
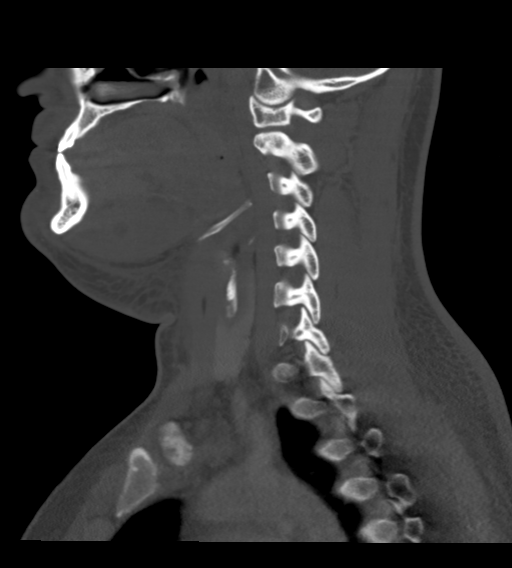
[im 88/132  bone]
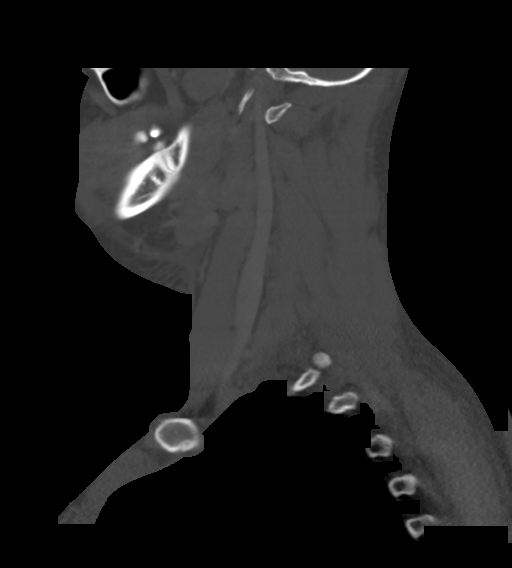

[Series 7: cor neck · coronal · 0.53mm/px · 3 of 112 slices shown]
[im 23/112  bone]
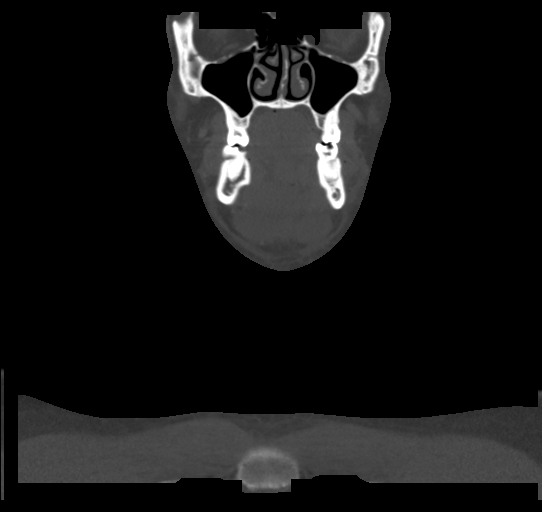
[im 45/112  bone]
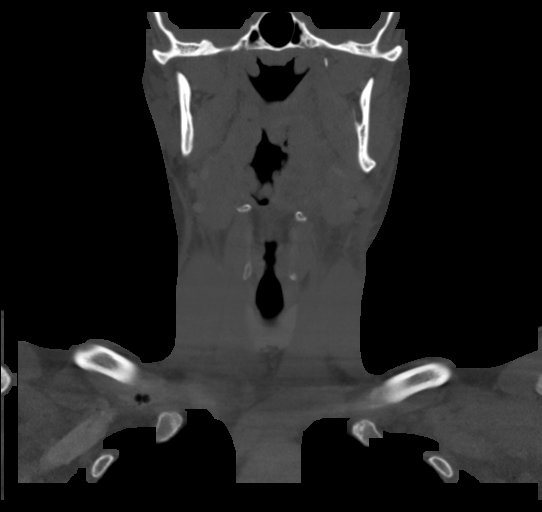
[im 67/112  bone]
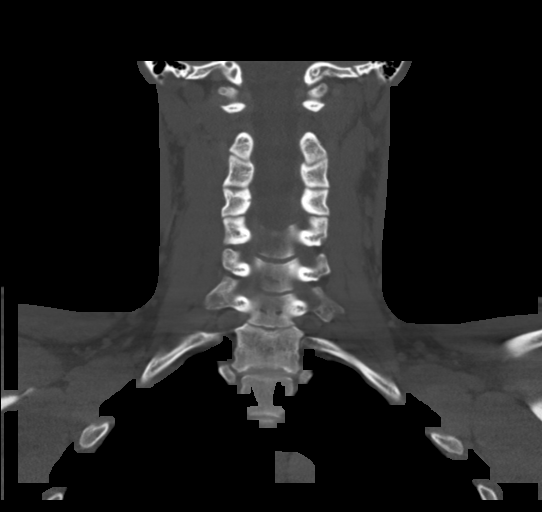

[Series 8: orthogonal ax · axial · 0.44mm/px · z∈[-209,-51]mm · 4 of 133 slices shown, 5 images]
[im 27/133  soft-tissue]
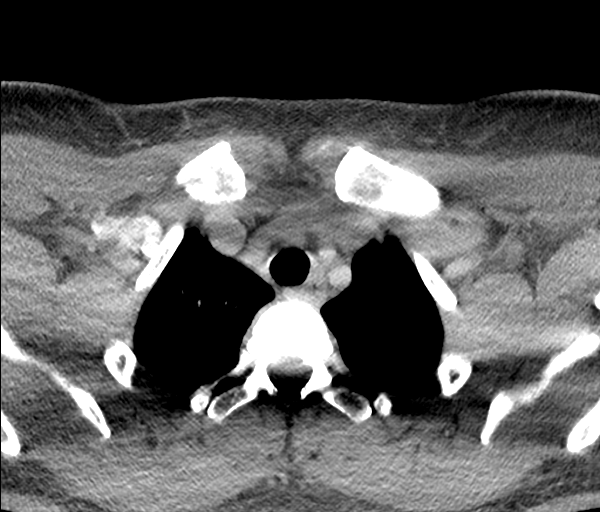
[im 27/133  bone]
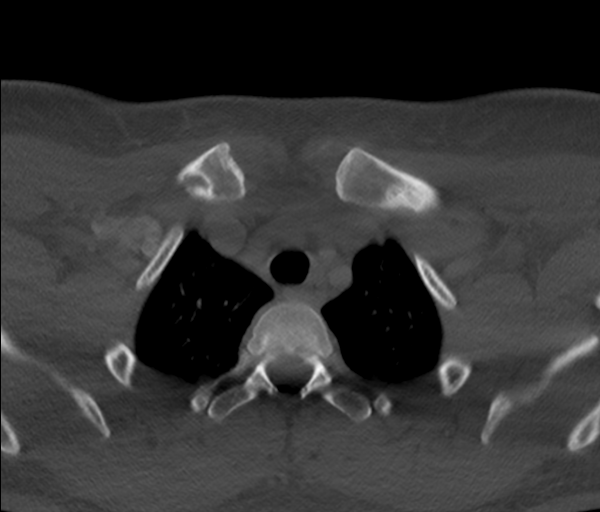
[im 53/133  bone]
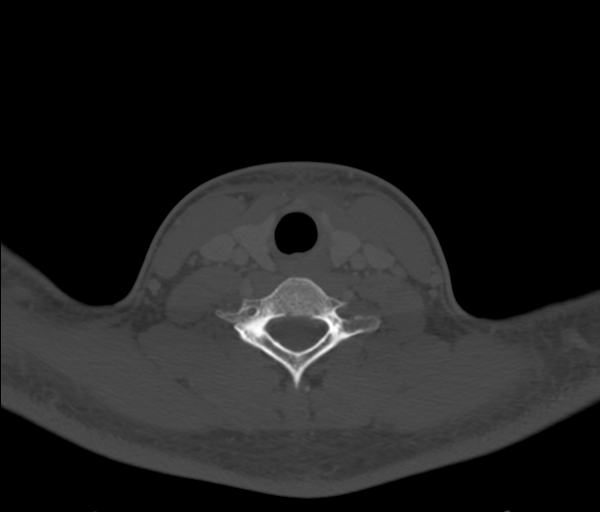
[im 80/133  bone]
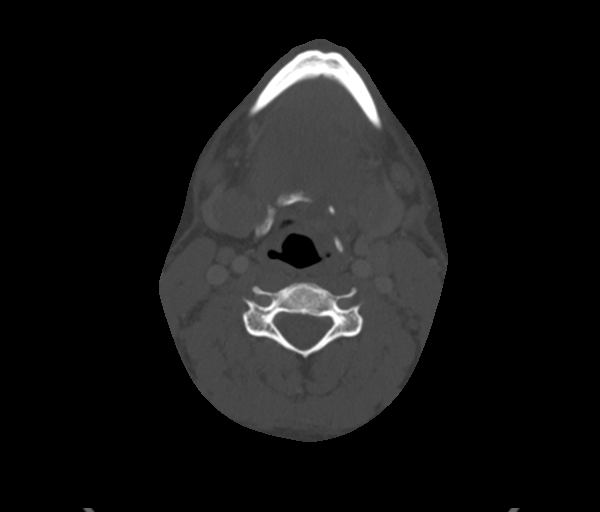
[im 106/133  bone]
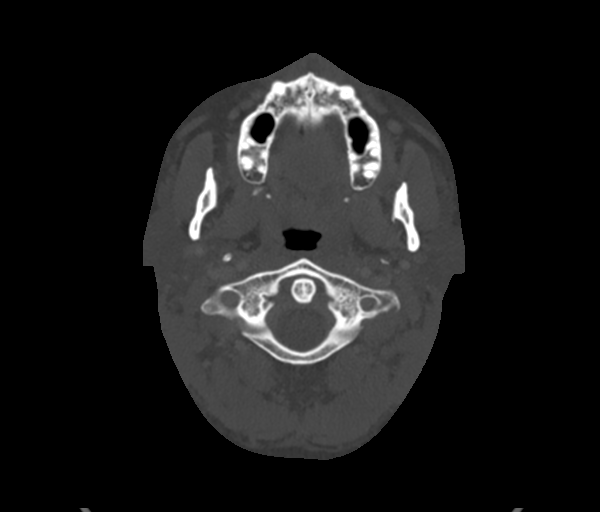

[15 of 33 positions shown; findings below may reference images not displayed]

FINDINGS: Pharynx and larynx: Mild hypertrophy of the tonsils bilaterally. No
mass or peritonsillar abscess. Remainder of the pharynx is normal.

Salivary glands: Parotid and submandibular glands normal
bilaterally.

Thyroid: Negative

Lymph nodes: Prominent lymph nodes throughout the neck bilaterally.
Right level 2 node 10 mm. Left level 2 node 14 mm. Left level 2
lymph node 10 mm. Multiple small posterior nodes bilaterally. These
likely reactive due to infection given the history.

Vascular: Patent

Limited intracranial: Negative

Visualized orbits: Negative

Mastoids and visualized paranasal sinuses: Negative

Skeleton: No acute skeletal abnormality. Mild lucency around left
lower first molar with associated caries. No evidence of periapical
abscess.

Upper chest: Negative

Other: Subcutaneous edema is present below the chin extending to the
platysmas muscle. 6 x 12 mm fluid collection deep to the left
mylohyoid muscle consistent with early abscess in the floor of the
mouth.
IMPRESSION: 6 x 12 mm rim enhancing fluid collection deep to the left mylohyoid
muscle compatible with abscess. Findings compatible with floor of
mouth infection, Dayal angina. No evidence of acute dental abscess.
There is soft tissue edema throughout the chin and upper neck.

Reactive cervical lymphadenopathy.

These results were called by telephone at the time of interpretation
on 06/13/2017 at [DATE] to Dr. Suur , who verbally acknowledged
these results.
